# Patient Record
Sex: Female | Born: 1975 | Race: White | Hispanic: No | Marital: Single | Smoking: Never smoker
Health system: Southern US, Community
[De-identification: ages and names within clinical notes are randomized; demographics above are authoritative.]

## PROBLEM LIST (undated history)

## (undated) DIAGNOSIS — E119 Type 2 diabetes mellitus without complications: Secondary | ICD-10-CM

## (undated) HISTORY — PX: TUBAL LIGATION: SHX77

---

## 1999-10-18 ENCOUNTER — Emergency Department (HOSPITAL_COMMUNITY): Admission: EM | Admit: 1999-10-18 | Discharge: 1999-10-18 | Payer: Self-pay | Admitting: Emergency Medicine

## 1999-12-07 ENCOUNTER — Other Ambulatory Visit: Admission: RE | Admit: 1999-12-07 | Discharge: 1999-12-07 | Payer: Self-pay | Admitting: Obstetrics and Gynecology

## 2000-03-14 ENCOUNTER — Other Ambulatory Visit: Admission: RE | Admit: 2000-03-14 | Discharge: 2000-03-14 | Payer: Self-pay | Admitting: Obstetrics and Gynecology

## 2000-05-25 ENCOUNTER — Inpatient Hospital Stay (HOSPITAL_COMMUNITY): Admission: AD | Admit: 2000-05-25 | Discharge: 2000-05-25 | Payer: Self-pay | Admitting: Obstetrics and Gynecology

## 2000-06-10 ENCOUNTER — Inpatient Hospital Stay (HOSPITAL_COMMUNITY): Admission: AD | Admit: 2000-06-10 | Discharge: 2000-06-12 | Payer: Self-pay | Admitting: Obstetrics and Gynecology

## 2001-11-02 ENCOUNTER — Other Ambulatory Visit: Admission: RE | Admit: 2001-11-02 | Discharge: 2001-11-02 | Payer: Self-pay | Admitting: Obstetrics and Gynecology

## 2002-03-03 ENCOUNTER — Inpatient Hospital Stay (HOSPITAL_COMMUNITY): Admission: AD | Admit: 2002-03-03 | Discharge: 2002-03-03 | Payer: Self-pay | Admitting: *Deleted

## 2002-04-20 ENCOUNTER — Inpatient Hospital Stay (HOSPITAL_COMMUNITY): Admission: AD | Admit: 2002-04-20 | Discharge: 2002-04-22 | Payer: Self-pay | Admitting: Obstetrics and Gynecology

## 2010-08-20 ENCOUNTER — Emergency Department: Payer: Self-pay | Admitting: Emergency Medicine

## 2010-09-29 ENCOUNTER — Emergency Department: Payer: Self-pay | Admitting: Emergency Medicine

## 2010-09-30 ENCOUNTER — Inpatient Hospital Stay: Payer: Self-pay | Admitting: Internal Medicine

## 2012-03-11 ENCOUNTER — Emergency Department: Payer: Self-pay

## 2012-03-11 LAB — URINALYSIS, COMPLETE
Bilirubin,UR: NEGATIVE
Blood: NEGATIVE
Glucose,UR: NEGATIVE mg/dL (ref 0–75)
Ketone: NEGATIVE
Leukocyte Esterase: NEGATIVE
Nitrite: NEGATIVE
Ph: 6 (ref 4.5–8.0)
Protein: NEGATIVE
RBC,UR: 1 /HPF (ref 0–5)
Specific Gravity: 1.019 (ref 1.003–1.030)
WBC UR: 1 /HPF (ref 0–5)

## 2012-03-11 LAB — COMPREHENSIVE METABOLIC PANEL
Alkaline Phosphatase: 96 U/L (ref 50–136)
Anion Gap: 9 (ref 7–16)
Bilirubin,Total: 0.2 mg/dL (ref 0.2–1.0)
Calcium, Total: 9 mg/dL (ref 8.5–10.1)
Chloride: 104 mmol/L (ref 98–107)
Co2: 28 mmol/L (ref 21–32)
EGFR (African American): >60
EGFR (Non-African Amer.): >60
Potassium: 3.6 mmol/L (ref 3.5–5.1)
SGOT(AST): 25 U/L (ref 15–37)
SGPT (ALT): 25 U/L (ref 12–78)

## 2012-03-11 LAB — PREGNANCY, URINE: Pregnancy Test, Urine: NEGATIVE m[IU]/mL

## 2012-03-11 LAB — CBC
HCT: 44.4 % (ref 35.0–47.0)
MCH: 30.3 pg (ref 26.0–34.0)
MCHC: 34.9 g/dL (ref 32.0–36.0)
Platelet: 293 10*3/uL (ref 150–440)
WBC: 10.3 10*3/uL (ref 3.6–11.0)

## 2014-06-29 ENCOUNTER — Ambulatory Visit: Payer: Self-pay | Admitting: Nurse Practitioner

## 2014-09-14 ENCOUNTER — Ambulatory Visit
Admit: 2014-09-14 | Disposition: A | Discharge status: Home / Self Care | Payer: Self-pay | Admitting: Emergency Medicine

## 2016-04-04 ENCOUNTER — Encounter: Payer: Self-pay | Admitting: Emergency Medicine

## 2016-04-04 ENCOUNTER — Emergency Department
Admission: EM | Admit: 2016-04-04 | Discharge: 2016-04-04 | Disposition: A | Discharge status: Home / Self Care | Payer: Self-pay | Attending: Emergency Medicine | Admitting: Emergency Medicine

## 2016-04-04 ENCOUNTER — Emergency Department: Payer: Self-pay

## 2016-04-04 DIAGNOSIS — Y929 Unspecified place or not applicable: Secondary | ICD-10-CM | POA: Insufficient documentation

## 2016-04-04 DIAGNOSIS — W228XXA Striking against or struck by other objects, initial encounter: Secondary | ICD-10-CM | POA: Insufficient documentation

## 2016-04-04 DIAGNOSIS — Y9389 Activity, other specified: Secondary | ICD-10-CM | POA: Insufficient documentation

## 2016-04-04 DIAGNOSIS — E119 Type 2 diabetes mellitus without complications: Secondary | ICD-10-CM | POA: Insufficient documentation

## 2016-04-04 DIAGNOSIS — S060X0A Concussion without loss of consciousness, initial encounter: Secondary | ICD-10-CM | POA: Insufficient documentation

## 2016-04-04 DIAGNOSIS — Y999 Unspecified external cause status: Secondary | ICD-10-CM | POA: Insufficient documentation

## 2016-04-04 DIAGNOSIS — S0990XA Unspecified injury of head, initial encounter: Secondary | ICD-10-CM | POA: Insufficient documentation

## 2016-04-04 HISTORY — DX: Type 2 diabetes mellitus without complications: E11.9

## 2016-04-04 MED ORDER — BUTALBITAL-APAP-CAFFEINE 50-325-40 MG PO TABS
2.0000 | ORAL_TABLET | Freq: Once | ORAL | Status: AC
  Administered 2016-04-04: 2 via ORAL
  Filled 2016-04-04: qty 2

## 2016-04-04 MED ORDER — BUTALBITAL-APAP-CAFFEINE 50-325-40 MG PO TABS
ORAL_TABLET | ORAL | Status: AC
  Filled 2016-04-04: qty 2
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
## 2016-11-12 ENCOUNTER — Encounter: Payer: Self-pay | Admitting: Emergency Medicine

## 2016-11-12 ENCOUNTER — Emergency Department
Admission: EM | Admit: 2016-11-12 | Discharge: 2016-11-12 | Disposition: A | Discharge status: Home / Self Care | Payer: Self-pay | Attending: Emergency Medicine | Admitting: Emergency Medicine

## 2016-11-12 DIAGNOSIS — R42 Dizziness and giddiness: Secondary | ICD-10-CM | POA: Insufficient documentation

## 2016-11-12 DIAGNOSIS — E1165 Type 2 diabetes mellitus with hyperglycemia: Secondary | ICD-10-CM | POA: Insufficient documentation

## 2016-11-12 DIAGNOSIS — R81 Glycosuria: Secondary | ICD-10-CM

## 2016-11-12 DIAGNOSIS — F1290 Cannabis use, unspecified, uncomplicated: Secondary | ICD-10-CM | POA: Insufficient documentation

## 2016-11-12 DIAGNOSIS — R251 Tremor, unspecified: Secondary | ICD-10-CM

## 2016-11-12 DIAGNOSIS — R739 Hyperglycemia, unspecified: Secondary | ICD-10-CM

## 2016-11-12 LAB — BASIC METABOLIC PANEL
ANION GAP: 9 (ref 5–15)
BUN: 11 mg/dL (ref 6–20)
CALCIUM: 9.2 mg/dL (ref 8.9–10.3)
CO2: 26 mmol/L (ref 22–32)
Chloride: 103 mmol/L (ref 101–111)
Creatinine, Ser: 0.7 mg/dL (ref 0.44–1.00)
GFR calc non Af Amer: >60 mL/min (ref >60)
GLUCOSE: 146 mg/dL — AB (ref 65–99)
Potassium: 3.4 mmol/L — ABNORMAL LOW (ref 3.5–5.1)
Sodium: 138 mmol/L (ref 135–145)

## 2016-11-12 LAB — URINALYSIS, COMPLETE (UACMP) WITH MICROSCOPIC
BACTERIA UA: NONE SEEN
BILIRUBIN URINE: NEGATIVE
Glucose, UA: >=500 mg/dL — AB
HGB URINE DIPSTICK: NEGATIVE
KETONES UR: NEGATIVE mg/dL
LEUKOCYTES UA: NEGATIVE
NITRITE: NEGATIVE
PH: 5 (ref 5.0–8.0)
Protein, ur: NEGATIVE mg/dL
SPECIFIC GRAVITY, URINE: 1.014 (ref 1.005–1.030)

## 2016-11-12 LAB — URINE DRUG SCREEN, QUALITATIVE (ARMC ONLY)
AMPHETAMINES, UR SCREEN: NOT DETECTED
BENZODIAZEPINE, UR SCRN: NOT DETECTED
Barbiturates, Ur Screen: NOT DETECTED
Cannabinoid 50 Ng, Ur ~~LOC~~: POSITIVE — AB
Cocaine Metabolite,Ur ~~LOC~~: NOT DETECTED
MDMA (ECSTASY) UR SCREEN: NOT DETECTED
METHADONE SCREEN, URINE: NOT DETECTED
OPIATE, UR SCREEN: NOT DETECTED
Phencyclidine (PCP) Ur S: NOT DETECTED
Tricyclic, Ur Screen: NOT DETECTED

## 2016-11-12 LAB — CBC
HEMATOCRIT: 42.8 % (ref 35.0–47.0)
HEMOGLOBIN: 14.7 g/dL (ref 12.0–16.0)
MCH: 29 pg (ref 26.0–34.0)
MCHC: 34.3 g/dL (ref 32.0–36.0)
MCV: 84.6 fL (ref 80.0–100.0)
Platelets: 284 10*3/uL (ref 150–440)
RBC: 5.06 MIL/uL (ref 3.80–5.20)
RDW: 14.4 % (ref 11.5–14.5)
WBC: 8.5 10*3/uL (ref 3.6–11.0)

## 2016-11-12 LAB — POCT PREGNANCY, URINE: PREG TEST UR: NEGATIVE

## 2016-11-12 MED ORDER — SODIUM CHLORIDE 0.9 % IV BOLUS (SEPSIS)
1000.0000 mL | Freq: Once | INTRAVENOUS | Status: AC
  Administered 2016-11-12: 1000 mL via INTRAVENOUS
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
## 2016-11-12 NOTE — ED Notes (Signed)
Pt ambulatory to toilet

## 2016-11-12 NOTE — ED Notes (Signed)
Pt up to toilet 

## 2016-11-12 NOTE — ED Triage Notes (Signed)
Pt states she went to a outdoor concert over the weekend and since last night she has been feeling weak all over with dizziness. States she has been trying to take in water with no resolve of sx.

## 2016-11-12 NOTE — Discharge Instructions (Signed)
Please drink plenty of fluids, get plenty of rest, and eat small regular healthy meals throughout the day. Please discontinue your marijuana use, as this may be contributing to her symptoms.  Your blood sugar was elevated today, and will need to be rechecked with her primary care physician.  Return to the emergency department if you develop severe pain, lightheadedness or fainting, fever, or any other symptoms concerning to you.

## 2016-11-14 ENCOUNTER — Emergency Department
Admission: EM | Admit: 2016-11-14 | Discharge: 2016-11-14 | Disposition: A | Discharge status: Home / Self Care | Payer: Self-pay | Attending: Emergency Medicine | Admitting: Emergency Medicine

## 2016-11-14 DIAGNOSIS — R5383 Other fatigue: Secondary | ICD-10-CM

## 2016-11-14 DIAGNOSIS — E748 Other specified disorders of carbohydrate metabolism: Secondary | ICD-10-CM | POA: Insufficient documentation

## 2016-11-14 DIAGNOSIS — E860 Dehydration: Secondary | ICD-10-CM | POA: Insufficient documentation

## 2016-11-14 DIAGNOSIS — R81 Glycosuria: Secondary | ICD-10-CM

## 2016-11-14 DIAGNOSIS — R5382 Chronic fatigue, unspecified: Secondary | ICD-10-CM | POA: Insufficient documentation

## 2016-11-14 LAB — CBC
HEMATOCRIT: 40.8 % (ref 35.0–47.0)
Hemoglobin: 13.9 g/dL (ref 12.0–16.0)
MCH: 28.9 pg (ref 26.0–34.0)
MCHC: 34.1 g/dL (ref 32.0–36.0)
MCV: 84.7 fL (ref 80.0–100.0)
Platelets: 294 10*3/uL (ref 150–440)
RBC: 4.82 MIL/uL (ref 3.80–5.20)
RDW: 14.3 % (ref 11.5–14.5)
WBC: 15.9 10*3/uL — AB (ref 3.6–11.0)

## 2016-11-14 LAB — BASIC METABOLIC PANEL
ANION GAP: 6 (ref 5–15)
BUN: 9 mg/dL (ref 6–20)
CALCIUM: 9.1 mg/dL (ref 8.9–10.3)
CO2: 26 mmol/L (ref 22–32)
Chloride: 103 mmol/L (ref 101–111)
Creatinine, Ser: 0.57 mg/dL (ref 0.44–1.00)
GFR calc Af Amer: >60 mL/min (ref >60)
GLUCOSE: 110 mg/dL — AB (ref 65–99)
POTASSIUM: 3.7 mmol/L (ref 3.5–5.1)
SODIUM: 135 mmol/L (ref 135–145)

## 2016-11-14 LAB — GLUCOSE, CAPILLARY: GLUCOSE-CAPILLARY: 107 mg/dL — AB (ref 65–99)

## 2016-11-14 NOTE — ED Notes (Signed)
Pt states she had CBG of 278 earlier today which is unusual for her. Pt states she usually has CBG of around 90 AFTER meals. Pt states she drank 4 20oz water bottles and got some rest after high CBG. States has been urinating and drinking more water x few weeks now. No hx of DM but borderline gestational DM during pregnancies. Alert, oriented, ambulatory from wheelchair to bed. Daughters with pt.

## 2016-11-14 NOTE — ED Notes (Signed)
CBG 107 

## 2016-11-14 NOTE — Discharge Instructions (Signed)
Please keep your appointment with her primary care physician this coming Monday as scheduled. Return to the emergency department sooner for any new or worsening symptoms such as worsening fatigue, if you cannot eat or drink, or for any other concerns.  It was a pleasure to take care of you today, and thank you for coming to our emergency department.  If you have any questions or concerns before leaving please ask the nurse to grab me and I'm more than happy to go through your aftercare instructions again.  If you were prescribed any opioid pain medication today such as Norco, Vicodin, Percocet, morphine, hydrocodone, or oxycodone please make sure you do not drive when you are taking this medication as it can alter your ability to drive safely.  If you have any concerns once you are home that you are not improving or are in fact getting worse before you can make it to your follow-up appointment, please do not hesitate to call 911 and come back for further evaluation.  Merrily BrittleNeil Sanaii Caporaso MD  Results for orders placed or performed during the hospital encounter of 11/14/16  Basic metabolic panel  Result Value Ref Range   Sodium 135 135 - 145 mmol/L   Potassium 3.7 3.5 - 5.1 mmol/L   Chloride 103 101 - 111 mmol/L   CO2 26 22 - 32 mmol/L   Glucose, Bld 110 (H) 65 - 99 mg/dL   BUN 9 6 - 20 mg/dL   Creatinine, Ser 1.610.57 0.44 - 1.00 mg/dL   Calcium 9.1 8.9 - 09.610.3 mg/dL   GFR calc non Af Amer >60 >60 mL/min   GFR calc Af Amer >60 >60 mL/min   Anion gap 6 5 - 15  CBC  Result Value Ref Range   WBC 15.9 (H) 3.6 - 11.0 K/uL   RBC 4.82 3.80 - 5.20 MIL/uL   Hemoglobin 13.9 12.0 - 16.0 g/dL   HCT 04.540.8 40.935.0 - 81.147.0 %   MCV 84.7 80.0 - 100.0 fL   MCH 28.9 26.0 - 34.0 pg   MCHC 34.1 32.0 - 36.0 g/dL   RDW 91.414.3 78.211.5 - 95.614.5 %   Platelets 294 150 - 440 K/uL  Glucose, capillary  Result Value Ref Range   Glucose-Capillary 107 (H) 65 - 99 mg/dL   Comment 1 Document in Chart

## 2016-11-14 NOTE — ED Triage Notes (Signed)
Pt reports history of hypoglycemia but checked her blood sugar today and it was 278. Pt states she feels very fatigued, and feels thirsty all the time. Pt reports she has a friend that is an EMT who told her to drink a lot of water. Pt appears very drowsy in triage.

## 2016-11-14 NOTE — ED Provider Notes (Signed)
[facility] Emergency Department Provider Note  ____________________________________________   First MD Initiated Contact with Patient 11/14/16 1655     (approximate)  I have reviewed the triage vital signs and the nursing notes.   HISTORY  Chief Complaint Hyperglycemia    HPI Jessica Faulkner is a 41 y.o. female who comes to the emergency department requesting evaluation for hyperglycemia at home. She does not have a history of diabetes, but rather has a long-standing history of hypoglycemia. For the past week or so she's felt generalized fatigue as well as polyuria and polydipsia. She is concerned that she may be developing diabetes. She was seen in our emergency department 2 days ago and had a blood sugar of 140 but was noted to have significant glucose in her urine and was advised to follow up with primary care. She cannot get an appointment until Monday. Today at work she felt lightheaded and weak so she called a paramedic friend of hers who checked her blood sugar and noted it was 278 and advised her to come to the emergency department. The patient reports no changes in her medications recently.Nothing seems to make her symptoms better or worse.   Past Medical History:  Diagnosis Date  . Diabetes mellitus without complication (HCC)     There are no active problems to display for this patient.   Past Surgical History:  Procedure Laterality Date  . TUBAL LIGATION      Prior to Admission medications   Not on File    Allergies Benadryl [diphenhydramine]  No family history on file.  Social History Social History  Substance Use Topics  . Smoking status: Never Smoker  . Smokeless tobacco: Never Used  . Alcohol use No    Review of Systems Constitutional: No fever/chills Eyes: No visual changes. ENT: No sore throat. Cardiovascular: Denies chest pain. Respiratory: Denies shortness of breath. Gastrointestinal: No abdominal pain.  No  nausea, no vomiting.  No diarrhea.  No constipation. Genitourinary: Negative for dysuria. Musculoskeletal: Negative for back pain. Skin: Negative for rash. Neurological: Negative for headaches, focal weakness or numbness. Endocrine:Polyuria and polydipsia   ____________________________________________   PHYSICAL EXAM:  VITAL SIGNS: ED Triage Vitals  Enc Vitals Group     BP 11/14/16 1617 131/83     Pulse Rate 11/14/16 1617 94     Resp 11/14/16 1617 18     Temp 11/14/16 1617 99.4 F (37.4 C)     Temp Source 11/14/16 1617 Oral     SpO2 11/14/16 1617 98 %     Weight 11/14/16 1614 180 lb (81.6 kg)     Height 11/14/16 1614 5\' 7"  (1.702 m)     Head Circumference --      Peak Flow --      Pain Score --      Pain Loc --      Pain Edu? --      Excl. in GC? --     Constitutional: Alert and oriented 4 somewhat anxious appearing but appropriate and cooperative no ketones on her breath Eyes: PERRL EOMI. Head: Atraumatic. Nose: No congestion/rhinnorhea. Mouth/Throat: No trismus Neck: No stridor.   Cardiovascular: Normal rate, regular rhythm. Grossly normal heart sounds.  Good peripheral circulation. Respiratory: Normal respiratory effort.  No retractions. Lungs CTAB and moving good air Gastrointestinal: Soft nontender Musculoskeletal: No lower extremity edema   Neurologic:  Normal speech and language. No gross focal neurologic deficits are appreciated. Skin:  Skin is warm, dry and intact. No  rash noted. Psychiatric: Somewhat anxious appearing.    ____________________________________________   DIFFERENTIAL  Diabetes mellitus, hyperglycemia, DKA, HHS ____________________________________________   LABS (all labs ordered are listed, but only abnormal results are displayed)  Labs Reviewed  BASIC METABOLIC PANEL - Abnormal; Notable for the following:       Result Value   Glucose, Bld 110 (*)    All other components within normal limits  CBC - Abnormal; Notable for the  following:    WBC 15.9 (*)    All other components within normal limits  GLUCOSE, CAPILLARY - Abnormal; Notable for the following:    Glucose-Capillary 107 (*)    All other components within normal limits  CBG MONITORING, ED    Normal blood sugar __________________________________________  EKG  ____________________________________________  RADIOLOGY   ____________________________________________   PROCEDURES  Procedure(s) performed: no  Procedures  Critical Care performed: no  Observation: no ____________________________________________   INITIAL IMPRESSION / ASSESSMENT AND PLAN / ED COURSE  Pertinent labs & imaging results that were available during my care of the patient were reviewed by me and considered in my medical decision making (see chart for details).  Fortunately today the patient's blood sugar is back to normal. She clearly did have hyperglycemia earlier and she does have glucose in her urine as of 2 days ago. She does have primary care follow-up on Monday. I do lengthy discussion with the patient that although I am not diagnosing her with diabetes mellitus right now she may very well be on the path towards developing it. Advised her to remain well-hydrated and continue with her current diet and to follow up with her primary care on Monday. Strict return percussion given. She is discharged home in improved condition.      ____________________________________________   FINAL CLINICAL IMPRESSION(S) / ED DIAGNOSES  Final diagnoses:  Dehydration  Fatigue, unspecified type  Glucosuria      NEW MEDICATIONS STARTED DURING THIS VISIT:  There are no discharge medications for this patient.    Note:  This document was prepared using Dragon voice recognition software and may include unintentional dictation errors.     Merrily Brittle, MD 11/15/16 2231
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
## 2017-12-17 ENCOUNTER — Encounter: Payer: Self-pay | Admitting: *Deleted

## 2017-12-17 ENCOUNTER — Other Ambulatory Visit: Payer: Self-pay

## 2017-12-17 ENCOUNTER — Emergency Department
Admission: EM | Admit: 2017-12-17 | Discharge: 2017-12-17 | Disposition: A | Discharge status: Home / Self Care | Payer: Self-pay | Attending: Emergency Medicine | Admitting: Emergency Medicine

## 2017-12-17 DIAGNOSIS — E119 Type 2 diabetes mellitus without complications: Secondary | ICD-10-CM | POA: Insufficient documentation

## 2017-12-17 DIAGNOSIS — L237 Allergic contact dermatitis due to plants, except food: Secondary | ICD-10-CM | POA: Insufficient documentation

## 2017-12-17 MED ORDER — PREDNISONE 10 MG (21) PO TBPK: ORAL_TABLET | ORAL | 0 refills | Status: AC

## 2017-12-17 MED ORDER — DEXAMETHASONE SODIUM PHOSPHATE 10 MG/ML IJ SOLN
10.0000 mg | Freq: Once | INTRAMUSCULAR | Status: AC
  Administered 2017-12-17: 10 mg via INTRAMUSCULAR
  Filled 2017-12-17: qty 1

## 2017-12-17 MED ORDER — CETIRIZINE HCL 10 MG PO CAPS: 10.0000 mg | ORAL_CAPSULE | Freq: Every day | ORAL | 3 refills | Status: AC

## 2017-12-17 NOTE — ED Notes (Signed)
Rash on arms , left ear and forehead for 3 days.  Pt has allergy to benadryl   Pt still has itching.  No resp distress.

## 2017-12-17 NOTE — ED Triage Notes (Signed)
Pt has itching rash on arms and left ear and forehead   No resp distress.  Pt alert.

## 2017-12-17 NOTE — ED Provider Notes (Signed)
[facility] Emergency Department Provider Note  ____________________________________________  Time seen: Approximately 4:26 PM  I have reviewed the triage vital signs and the nursing notes.   HISTORY  Chief Complaint Rash   HPI Jessica Faulkner is a 42 y.o. female who presents to the emergency department for treatment and evaluation of poison ivy.  Patient states that she was exposed  3 days ago.  She has had no relief with calamine lotion or ivy rest lotion.  She is allergic to Benadryl.  She denies shortness of breath  Past Medical History:  Diagnosis Date  . Diabetes mellitus without complication (HCC)     There are no active problems to display for this patient.   Past Surgical History:  Procedure Laterality Date  . TUBAL LIGATION      Prior to Admission medications   Medication Sig Start Date End Date Taking? Authorizing Provider  Cetirizine HCl 10 MG CAPS Take 1 capsule (10 mg total) by mouth daily. 12/17/17   Donni Oglesby B, FNP  predniSONE (STERAPRED UNI-PAK 21 TAB) 10 MG (21) TBPK tablet Take 6 tablets on day 1 Take 5 tablets on day 2 Take 4 tablets on day 3 Take 3 tablets on day 4 Take 2 tablets on day 5 Take 1 tablet on day 6 12/17/17   Kateryn Marasigan B, FNP    Allergies Benadryl [diphenhydramine]  No family history on file.  Social History Social History   Tobacco Use  . Smoking status: Never Smoker  . Smokeless tobacco: Never Used  Substance Use Topics  . Alcohol use: No  . Drug use: Not on file    Review of Systems  Constitutional: Negative for fever. Respiratory: Negative for cough or shortness of breath.  Musculoskeletal: Negative for myalgias Skin: Positive for pruritic rash on extremities forehead, and, left ear Neurological: Negative for numbness or paresthesias. ____________________________________________   PHYSICAL EXAM:  VITAL SIGNS: ED Triage Vitals  Enc Vitals Group     BP 12/17/17 1620 (!) 146/89      Pulse Rate 12/17/17 1620 82     Resp --      Temp 12/17/17 1620 98.4 F (36.9 C)     Temp Source 12/17/17 1620 Oral     SpO2 12/17/17 1620 99 %     Weight 12/17/17 1621 180 lb (81.6 kg)     Height 12/17/17 1621 5\' 8"  (1.727 m)     Head Circumference --      Peak Flow --      Pain Score 12/17/17 1621 7     Pain Loc --      Pain Edu? --      Excl. in GC? --      Constitutional: Well appearing. Eyes: Conjunctivae are clear without discharge or drainage. Nose: No rhinorrhea noted. Mouth/Throat: Airway is patent.  Neck: No stridor. Unrestricted range of motion observed. Cardiovascular: Capillary refill is <3 seconds.  Respiratory: Respirations are even and unlabored.. Musculoskeletal: Unrestricted range of motion observed. Neurologic: Awake, alert, and oriented x 4.  Skin: Linear, vesicular, partially scabbed and excoriated areas noted over the auricle of the left ear, forehead, and bilateral upper extremities.  ____________________________________________   LABS (all labs ordered are listed, but only abnormal results are displayed)  Labs Reviewed - No data to display ____________________________________________  EKG  Not indicated. ____________________________________________  RADIOLOGY  Not indicated ____________________________________________   PROCEDURES  Procedures ____________________________________________   INITIAL IMPRESSION / ASSESSMENT AND PLAN / ED COURSE  Jessica Faulkner  is a 42 y.o. female who presents to the emergency department for treatment and evaluation of pruritic rash consistent with poison ivy dermatitis.  While here, she was given a shot of Decadron.  She was instructed to fill her prescription for prednisone and take it after 24 hours.  She was instructed to continue using her calamine lotion or over-the-counter anti-itch medications.  She was also given a prescription for cetirizine as she is allergic to Benadryl.  She was instructed  to avoid scratching if at all possible.  She was instructed to follow-up with her primary care provider or return to the emergency department for any sign or concern of infection.   Medications  dexamethasone (DECADRON) injection 10 mg (10 mg Intramuscular Given 12/17/17 1644)     Pertinent labs & imaging results that were available during my care of the patient were reviewed by me and considered in my medical decision making (see chart for details).  ____________________________________________   FINAL CLINICAL IMPRESSION(S) / ED DIAGNOSES  Final diagnoses:  Contact dermatitis due to poison ivy    ED Discharge Orders        Ordered    predniSONE (STERAPRED UNI-PAK 21 TAB) 10 MG (21) TBPK tablet     12/17/17 1639    Cetirizine HCl 10 MG CAPS  Daily     12/17/17 1641       Note:  This document was prepared using Dragon voice recognition software and may include unintentional dictation errors.    Chinita Pester, FNP 12/17/17 2044    Dionne Bucy, MD 12/18/17 0004

## 2019-01-07 ENCOUNTER — Ambulatory Visit: Payer: Self-pay

## 2023-06-10 ENCOUNTER — Other Ambulatory Visit: Payer: Self-pay | Admitting: Family Medicine

## 2023-06-10 DIAGNOSIS — N63 Unspecified lump in unspecified breast: Secondary | ICD-10-CM
